# Patient Record
Sex: Female | Born: 1955 | Race: Black or African American | Hispanic: No | Marital: Married | Smoking: Never smoker
Health system: Southern US, Community
[De-identification: ages and names within clinical notes are randomized; demographics above are authoritative.]

## PROBLEM LIST (undated history)

## (undated) DIAGNOSIS — R87629 Unspecified abnormal cytological findings in specimens from vagina: Secondary | ICD-10-CM

## (undated) DIAGNOSIS — D869 Sarcoidosis, unspecified: Secondary | ICD-10-CM

## (undated) DIAGNOSIS — H269 Unspecified cataract: Secondary | ICD-10-CM

## (undated) HISTORY — DX: Unspecified abnormal cytological findings in specimens from vagina: R87.629

## (undated) HISTORY — DX: Unspecified cataract: H26.9

## (undated) HISTORY — PX: NECK SURGERY: SHX720

## (undated) HISTORY — DX: Sarcoidosis, unspecified: D86.9

## (undated) HISTORY — PX: OTHER SURGICAL HISTORY: SHX169

## (undated) HISTORY — PX: TUBAL LIGATION: SHX77

## (undated) HISTORY — PX: HERNIA REPAIR: SHX51

---

## 1998-11-01 ENCOUNTER — Other Ambulatory Visit: Admission: RE | Admit: 1998-11-01 | Discharge: 1998-11-01 | Payer: Self-pay | Admitting: Obstetrics & Gynecology

## 2000-07-21 ENCOUNTER — Other Ambulatory Visit: Admission: RE | Admit: 2000-07-21 | Discharge: 2000-07-21 | Payer: Self-pay | Admitting: Obstetrics & Gynecology

## 2001-12-25 ENCOUNTER — Emergency Department (HOSPITAL_COMMUNITY): Admission: EM | Admit: 2001-12-25 | Discharge: 2001-12-25 | Payer: Self-pay | Admitting: Emergency Medicine

## 2002-01-03 ENCOUNTER — Encounter: Payer: Self-pay | Admitting: General Surgery

## 2002-01-03 ENCOUNTER — Encounter: Admission: RE | Admit: 2002-01-03 | Discharge: 2002-01-03 | Payer: Self-pay | Admitting: General Surgery

## 2002-01-05 ENCOUNTER — Ambulatory Visit (HOSPITAL_BASED_OUTPATIENT_CLINIC_OR_DEPARTMENT_OTHER): Admission: RE | Admit: 2002-01-05 | Discharge: 2002-01-05 | Payer: Self-pay | Admitting: General Surgery

## 2002-01-05 ENCOUNTER — Encounter (INDEPENDENT_AMBULATORY_CARE_PROVIDER_SITE_OTHER): Payer: Self-pay | Admitting: Specialist

## 2002-07-11 ENCOUNTER — Other Ambulatory Visit: Admission: RE | Admit: 2002-07-11 | Discharge: 2002-07-11 | Payer: Self-pay | Admitting: Obstetrics & Gynecology

## 2005-02-04 ENCOUNTER — Other Ambulatory Visit: Admission: RE | Admit: 2005-02-04 | Discharge: 2005-02-04 | Payer: Self-pay | Admitting: Obstetrics & Gynecology

## 2010-02-13 ENCOUNTER — Encounter
Admission: RE | Admit: 2010-02-13 | Discharge: 2010-02-13 | Payer: Self-pay | Source: Home / Self Care | Attending: Obstetrics & Gynecology | Admitting: Obstetrics & Gynecology

## 2010-07-04 NOTE — Op Note (Signed)
NAME:  Melanie Hicks                         ACCOUNT NO.:  [id number]   MEDICAL RECORD NO.:  [id number]                   PATIENT TYPE:  EMS   LOCATION:  MAJO                                 FACILITY:  [facility]   PHYSICIAN:  Anselm Pancoast. Zachery Dakins, M.D.          DATE OF BIRTH:  08-31-1955   DATE OF PROCEDURE:  01/05/2002  DATE OF DISCHARGE:  12/25/2001                                 OPERATIVE REPORT   PREOPERATIVE DIAGNOSIS:  Ventral hernia.   POSTOPERATIVE DIAGNOSIS:  Ventral hernia.   OPERATION PERFORMED:  Repair of incarcerated ventral hernia with general  anesthesia.   SURGEON:  Anselm Pancoast. Zachery Dakins, M.D.   ASSISTANT:  Nurse.   ANESTHESIA:  General.   INDICATIONS FOR PROCEDURE:  The patient is a 55 year old female who was  referred to [place] for management of a bulge in the epigastric area.  The  patient stated that she had first noticed this several months or more ago.  It kind of gradually increased in size and if she strains real hard, it will  be about probably four inches or so across, but the actual area appears to  be a small fascial defect.  In her past history, she was diagnosed as having  sarcoidosis with an abnormal chest x-ray in her 73s, but she has had no  progression or problems pulmonary wise and is otherwise thought to be in  good health.  I recommended that we repair this with general anesthesia and  place a piece of mesh in the preperitoneal area and I think that actually  what we got is incarcerated preperitoneal fat up into and above the fascia  through a small fascial defect.  I scheduled her at [facility] and she is here today for the planned procedure.   DESCRIPTION OF PROCEDURE:  She was given a gram of Kefzol and taken back to  the operative suite, induction of general anesthesia.  We had marked the  general area and then after the abdomen was prepped with Betadine surgical  scrub and draped in sterile manner, a small vertical  incision was made  directly over the area.  I dissected down and in the subcutaneous tissue  there was an area that was obviously different fatty tissue and this is the  top of an area that at first I thought it maybe was a lipoma but it was  obviously coming up through the ventral fascia and the area was probably  about four inches across but the actual little central portion was kind of a  small mushroom type stalk area.  I carefully opened into it making sure that  there was no bowel up in the area and I think it predominantly all  preperitoneal  fat and the pedicle was divided between [facility] and these  were ligated with either 2 or 3-0 Vicryl and some were sutured with 2-0  Vicryl.  I then  opened the fascia just a little bit so I would have enough  working room that I could get a piece of mesh up in the preperitoneal space  about 3 x 3 inches.  I anchored it laterally in all four corners with 0  Prolene sutured up widely placing the stitches actually through the  abdominal incision and then these were all tied after the mesh was in place  and then I closed the actual fascia over it incorporating the fascia to the  actual mesh also with 0 Prolene sutures.  The subcutaneous tissue was then  closed with 3-0 Vicryl and the skin was closed with 4-0 nylon.  I placed  Marcaine in the fascia to hopefully minimize the postoperative pain and I  think she can be released after a short stay in the recovery room.  She is  going to be off work for approximately two weeks.  She works at a day care  and she is aware that she should not be picking up anything over about 25  pounds for probably about four weeks.  I could not appreciate any other  definite fascia  defects and I did send this very large specimen for pathology exam but I  expect it is just a fatty tissue that is really not a lipoma but just  incarceration of the preperitoneal space, tissue that had occurred over a  significant length of  time.                                                 Anselm Pancoast. Zachery Dakins, M.D.    WJW/MEDQ  D:  01/05/2002  T:  01/05/2002  Job:  [id number]   cc:   Duncan Dull, M.D.  [address]  [place]  [place] [address]  Fax: [phone]

## 2013-07-28 ENCOUNTER — Other Ambulatory Visit: Payer: Self-pay | Admitting: Obstetrics & Gynecology

## 2013-07-28 DIAGNOSIS — R928 Other abnormal and inconclusive findings on diagnostic imaging of breast: Secondary | ICD-10-CM

## 2013-08-08 ENCOUNTER — Other Ambulatory Visit: Payer: Self-pay

## 2013-08-16 ENCOUNTER — Ambulatory Visit
Admission: RE | Admit: 2013-08-16 | Discharge: 2013-08-16 | Disposition: A | Discharge status: Home / Self Care | Payer: BC Managed Care – PPO | Source: Ambulatory Visit | Attending: Obstetrics & Gynecology | Admitting: Obstetrics & Gynecology

## 2013-08-16 DIAGNOSIS — R928 Other abnormal and inconclusive findings on diagnostic imaging of breast: Secondary | ICD-10-CM

## 2015-03-29 ENCOUNTER — Encounter: Payer: Self-pay | Admitting: Gastroenterology

## 2015-05-24 ENCOUNTER — Ambulatory Visit (AMBULATORY_SURGERY_CENTER): Payer: Self-pay | Admitting: *Deleted

## 2015-05-24 VITALS — Ht 62.5 in | Wt 181.8 lb

## 2015-05-24 DIAGNOSIS — Z1211 Encounter for screening for malignant neoplasm of colon: Secondary | ICD-10-CM

## 2015-05-24 MED ORDER — SUPREP BOWEL PREP KIT 17.5-3.13-1.6 GM/177ML PO SOLN: 1.0000 | Freq: Once | ORAL | Status: DC

## 2015-05-24 NOTE — Progress Notes (Signed)
Patient denies any allergies to egg or soy products. Patient denies complications with anesthesia/sedation.  Patient denies oxygen use at home and denies diet medications. Emmi instructions for colonoscopy explained and given to patient.  

## 2015-06-07 ENCOUNTER — Encounter: Payer: Self-pay | Admitting: Gastroenterology

## 2015-06-07 ENCOUNTER — Ambulatory Visit (AMBULATORY_SURGERY_CENTER): Payer: BC Managed Care – PPO | Admitting: Gastroenterology

## 2015-06-07 VITALS — BP 106/75 | HR 74 | Temp 96.0°F | Resp 17 | Ht 62.5 in | Wt 181.0 lb

## 2015-06-07 DIAGNOSIS — Z1211 Encounter for screening for malignant neoplasm of colon: Secondary | ICD-10-CM | POA: Diagnosis not present

## 2015-06-07 DIAGNOSIS — Z800 Family history of malignant neoplasm of digestive organs: Secondary | ICD-10-CM

## 2015-06-07 DIAGNOSIS — D122 Benign neoplasm of ascending colon: Secondary | ICD-10-CM | POA: Diagnosis not present

## 2015-06-07 DIAGNOSIS — D123 Benign neoplasm of transverse colon: Secondary | ICD-10-CM | POA: Diagnosis not present

## 2015-06-07 MED ORDER — SODIUM CHLORIDE 0.9 % IV SOLN: 500.0000 mL | INTRAVENOUS | Status: DC

## 2015-06-07 NOTE — Op Note (Signed)
[facility] Patient Name: Melanie Hicks Procedure Date: 06/07/2015 8:58 AM MRN: [id number] Endoscopist: Lyman. Loletha Carrow , MD Age: 60 Date of Birth: Dec 05, 1955 Gender: Female Procedure:                Colonoscopy Indications:              Screening in patient at increased risk: Colorectal                            cancer in sister before age 28 Medicines:                Monitored Anesthesia Care Procedure:                Pre-Anesthesia Assessment:                           - Prior to the procedure, a History and Physical                            was performed, and patient medications and                            allergies were reviewed. The patient's tolerance of                            previous anesthesia was also reviewed. The risks                            and benefits of the procedure and the sedation                            options and risks were discussed with the patient.                            All questions were answered, and informed consent                            was obtained. Prior Anticoagulants: The patient has                            taken aspirin, last dose was 1 day prior to                            procedure. ASA Grade Assessment: II - A patient                            with mild systemic disease. After reviewing the                            risks and benefits, the patient was deemed in                            satisfactory condition to undergo the procedure.  After obtaining informed consent, the colonoscope                            was passed under direct vision. Throughout the                            procedure, the patient's blood pressure, pulse, and                            oxygen saturations were monitored continuously. The                            Model PCF-H190L [id number] [id number]) scope was introduced                            through the anus and advanced to the the cecum,            identified by appendiceal orifice and ileocecal                            valve. The colonoscopy was performed without                            difficulty. The patient tolerated the procedure                            well. The quality of the bowel preparation was                            excellent. The ileocecal valve, appendiceal                            orifice, and rectum were photographed. Scope In: 9:10:24 AM Scope Out: 9:32:16 AM Scope Withdrawal Time: 0 hours 18 minutes 46 seconds  Total Procedure Duration: 0 hours 21 minutes 52 seconds  Findings:                 The perianal and digital rectal examinations were                            normal.                           Three semi-pedunculated polyps were found in the                            proximal transverse colon, proximal ascending colon                            and distal ascending colon. The polyps were 8 to 12                            mm in size. These polyps were removed with a hot  snare. Resection and retrieval were complete.                           The exam was otherwise without abnormality on                            direct and retroflexion views. Complications:            No immediate complications. Estimated Blood Loss:     Estimated blood loss: none. Impression:               - Three 8 to 12 mm polyps in the proximal                            transverse colon, in the proximal ascending colon                            and in the distal ascending colon, removed with a                            hot snare. Resected and retrieved.                           - The examination was otherwise normal on direct                            and retroflexion views. Recommendation:           - Resume previous diet.                           - No aspirin, ibuprofen, naproxen, or other                            non-steroidal anti-inflammatory drugs for 5 days                             after polyp removal.                           - Await pathology results.                           - Repeat colonoscopy is recommended for                            surveillance. The colonoscopy date will be                            determined after pathology results from today's                            exam become available for review. Henry L. Loletha Carrow, MD 06/07/2015 9:37:29 AM This report has been signed electronically.

## 2015-06-07 NOTE — Progress Notes (Signed)
Called to room to assist during endoscopic procedure.  Patient ID and intended procedure confirmed with present staff. Received instructions for my participation in the procedure from the performing physician.  

## 2015-06-07 NOTE — Progress Notes (Signed)
To pacu vss patent aw report to rn 

## 2015-06-07 NOTE — Patient Instructions (Signed)
YOU HAD AN ENDOSCOPIC PROCEDURE TODAY AT [facility]:   Refer to the procedure report that was given to you for any specific questions about what was found during the examination.  If the procedure report does not answer your questions, please call your gastroenterologist to clarify.  If you requested that your care partner not be given the details of your procedure findings, then the procedure report has been included in a sealed envelope for you to review at your convenience later.  YOU SHOULD EXPECT: Some feelings of bloating in the abdomen. Passage of more gas than usual.  Walking can help get rid of the air that was put into your GI tract during the procedure and reduce the bloating. If you had a lower endoscopy (such as a colonoscopy or flexible sigmoidoscopy) you may notice spotting of blood in your stool or on the toilet paper. If you underwent a bowel prep for your procedure, you may not have a normal bowel movement for a few days.  Please Note:  You might notice some irritation and congestion in your nose or some drainage.  This is from the oxygen used during your procedure.  There is no need for concern and it should clear up in a day or so.  SYMPTOMS TO REPORT IMMEDIATELY:   Following lower endoscopy (colonoscopy or flexible sigmoidoscopy):  Excessive amounts of blood in the stool  Significant tenderness or worsening of abdominal pains  Swelling of the abdomen that is new, acute  Fever of 100F or higher  For urgent or emergent issues, a gastroenterologist can be reached at any hour by calling [phone].   DIET: Your first meal following the procedure should be a small meal and then it is ok to progress to your normal diet. Heavy or fried foods are harder to digest and may make you feel nauseous or bloated.  Likewise, meals heavy in dairy and vegetables can increase bloating.  Drink plenty of fluids but you should avoid alcoholic beverages for 24  hours.  ACTIVITY:  You should plan to take it easy for the rest of today and you should NOT DRIVE or use heavy machinery until tomorrow (because of the sedation medicines used during the test).    FOLLOW UP: Our staff will call the number listed on your records the next business day following your procedure to check on you and address any questions or concerns that you may have regarding the information given to you following your procedure. If we do not reach you, we will leave a message.  However, if you are feeling well and you are not experiencing any problems, there is no need to return our call.  We will assume that you have returned to your regular daily activities without incident.  If any biopsies were taken you will be contacted by phone or by letter within the next 1-3 weeks.  Please call us at [phone] if you have not heard about the biopsies in 3 weeks.    SIGNATURES/CONFIDENTIALITY: You and/or your care partner have signed paperwork which will be entered into your electronic medical record.  These signatures attest to the fact that that the information above on your After Visit Summary has been reviewed and is understood.  Full responsibility of the confidentiality of this discharge information lies with you and/or your care-partner.  Polyps-handout given  Repeat colonoscopy will be determined by pathology.  Hold aspirin and NSAIDs for 5 days (ibuprofen,motrin, advil, aleve, naproxen,etc)

## 2015-06-10 ENCOUNTER — Telehealth: Payer: Self-pay

## 2015-06-10 NOTE — Telephone Encounter (Signed)
  Follow up Call-  Call back number 06/07/2015  Post procedure Call Back phone  # [phone]  Permission to leave phone message Yes     Patient questions:  Do you have a fever, pain , or abdominal swelling? No. Pain Score  0 *  Have you tolerated food without any problems? Yes.    Have you been able to return to your normal activities? Yes.    Do you have any questions about your discharge instructions: Diet   No. Medications  No. Follow up visit  No.  Do you have questions or concerns about your Care? No.  Actions: * If pain score is 4 or above: No action needed, pain <4.  No problems noted per the pt. maw

## 2015-06-11 ENCOUNTER — Encounter: Payer: Self-pay | Admitting: Gastroenterology

## 2015-10-03 ENCOUNTER — Other Ambulatory Visit: Payer: Self-pay | Admitting: Obstetrics & Gynecology

## 2015-10-03 DIAGNOSIS — Z1231 Encounter for screening mammogram for malignant neoplasm of breast: Secondary | ICD-10-CM

## 2015-10-16 ENCOUNTER — Ambulatory Visit
Admission: RE | Admit: 2015-10-16 | Discharge: 2015-10-16 | Disposition: A | Discharge status: Home / Self Care | Payer: BC Managed Care – PPO | Source: Ambulatory Visit | Attending: Obstetrics & Gynecology | Admitting: Obstetrics & Gynecology

## 2015-10-16 DIAGNOSIS — Z1231 Encounter for screening mammogram for malignant neoplasm of breast: Secondary | ICD-10-CM

## 2017-09-30 ENCOUNTER — Other Ambulatory Visit: Payer: Self-pay | Admitting: Obstetrics & Gynecology

## 2017-09-30 DIAGNOSIS — Z1231 Encounter for screening mammogram for malignant neoplasm of breast: Secondary | ICD-10-CM

## 2017-10-14 ENCOUNTER — Ambulatory Visit
Admission: RE | Admit: 2017-10-14 | Discharge: 2017-10-14 | Disposition: A | Discharge status: Home / Self Care | Payer: BC Managed Care – PPO | Source: Ambulatory Visit | Attending: Obstetrics & Gynecology | Admitting: Obstetrics & Gynecology

## 2017-10-14 DIAGNOSIS — Z1231 Encounter for screening mammogram for malignant neoplasm of breast: Secondary | ICD-10-CM

## 2018-06-24 ENCOUNTER — Encounter: Payer: Self-pay | Admitting: Gastroenterology

## 2019-05-09 ENCOUNTER — Other Ambulatory Visit: Payer: Self-pay | Admitting: Obstetrics and Gynecology

## 2019-05-09 DIAGNOSIS — Z1231 Encounter for screening mammogram for malignant neoplasm of breast: Secondary | ICD-10-CM

## 2019-05-10 ENCOUNTER — Ambulatory Visit: Payer: BC Managed Care – PPO

## 2019-06-19 ENCOUNTER — Other Ambulatory Visit: Payer: Self-pay

## 2019-06-19 ENCOUNTER — Ambulatory Visit
Admission: RE | Admit: 2019-06-19 | Discharge: 2019-06-19 | Disposition: A | Discharge status: Home / Self Care | Payer: BC Managed Care – PPO | Source: Ambulatory Visit | Attending: Obstetrics and Gynecology | Admitting: Obstetrics and Gynecology

## 2019-06-19 DIAGNOSIS — Z1231 Encounter for screening mammogram for malignant neoplasm of breast: Secondary | ICD-10-CM

## 2020-09-18 IMAGING — MG DIGITAL SCREENING BILAT W/ TOMO W/ CAD
6 of 10 series · 6 of 30 positions shown · non-contrast
Comparison: Previous exam(s).

CLINICAL DATA: Screening.

EXAM:
DIGITAL SCREENING BILATERAL MAMMOGRAM WITH TOMO AND CAD

[L CC synth-2D (1 of 2)]
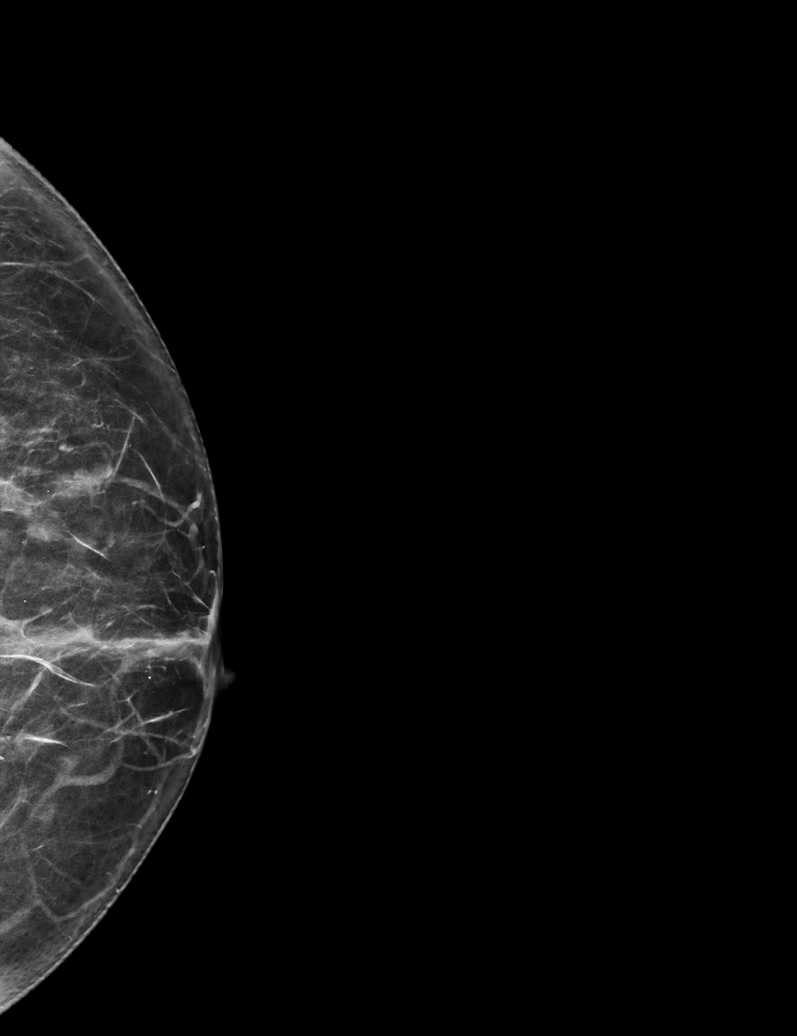

[R MLO synth-2D]
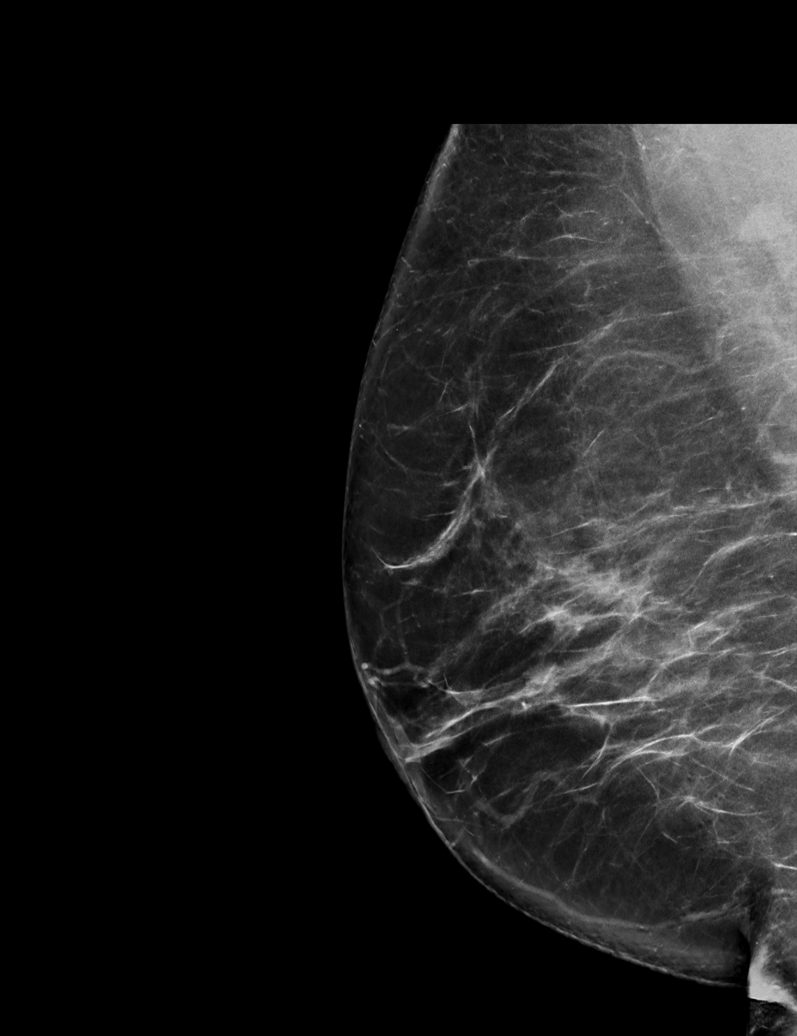

[L CC synth-2D (2 of 2)]
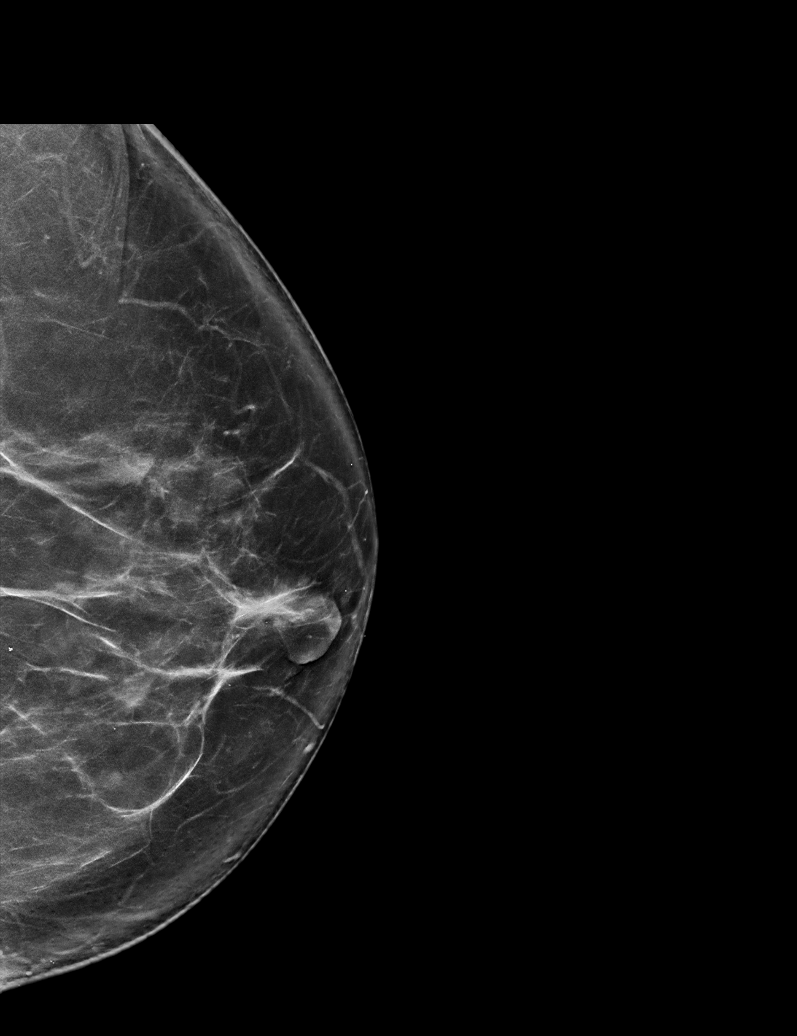

[L MLO synth-2D]
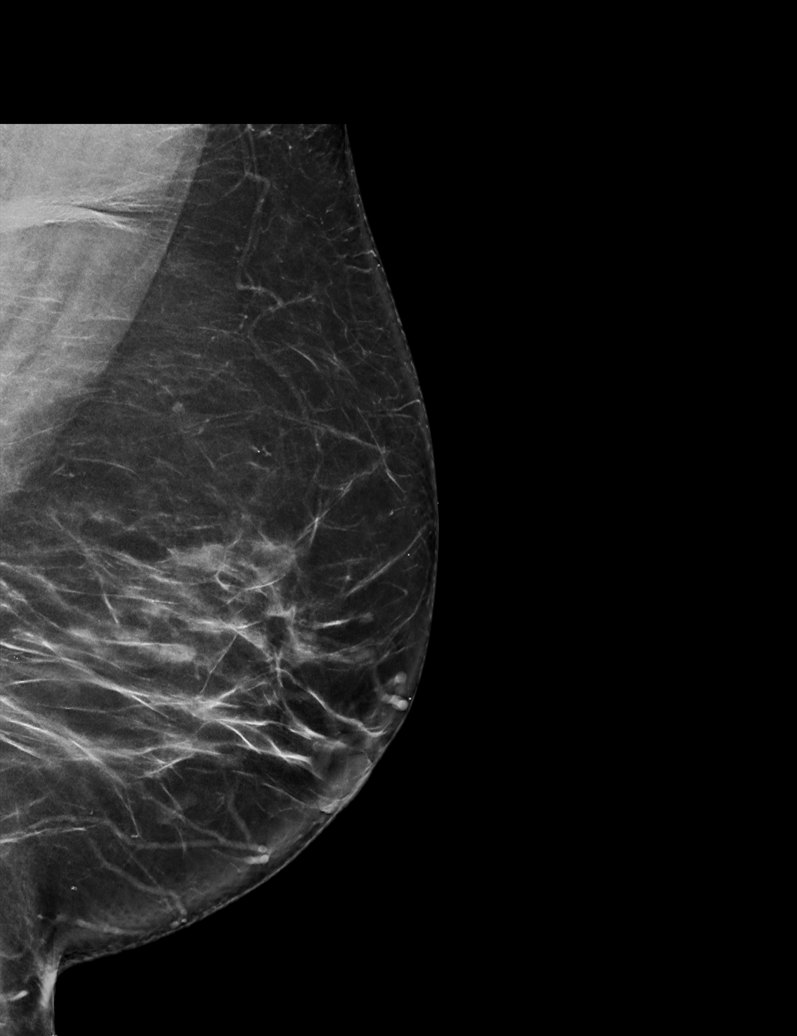

[R CC synth-2D]
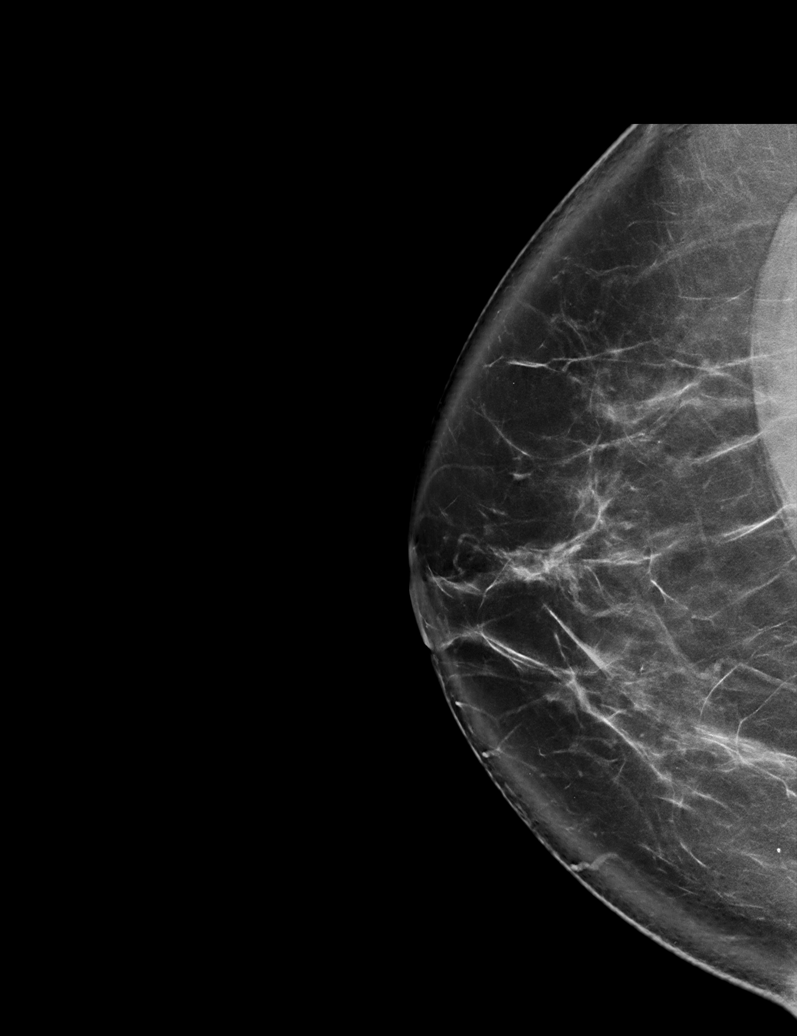

[L CC tomo · tomo slice 31/60.0]
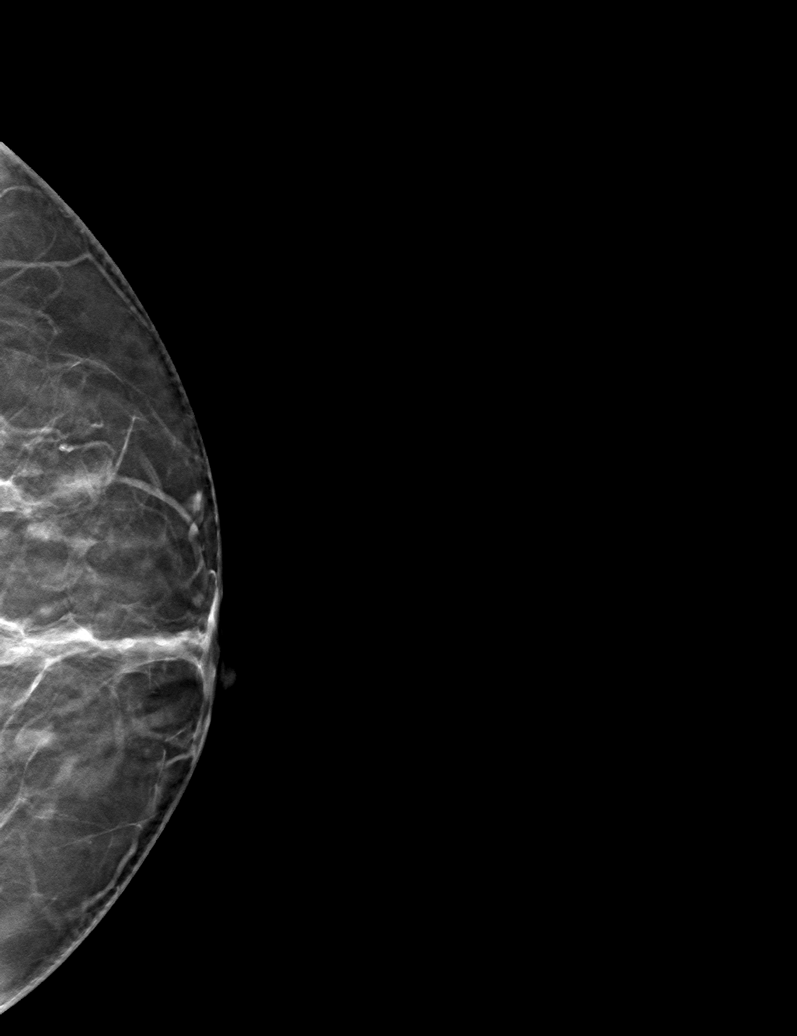

[6 of 30 positions shown; findings below may reference images not displayed]

ACR Breast Density Category b: There are scattered areas of
fibroglandular density.
FINDINGS: There are no findings suspicious for malignancy. Images were
processed with CAD.
IMPRESSION: No mammographic evidence of malignancy. A result letter of this
screening mammogram will be mailed directly to the patient.

RECOMMENDATION:
Screening mammogram in one year. (Code:CN-U-775)

BI-RADS CATEGORY  1: Negative.

## 2022-04-25 ENCOUNTER — Encounter (HOSPITAL_COMMUNITY): Payer: Self-pay

## 2022-04-25 ENCOUNTER — Ambulatory Visit (HOSPITAL_COMMUNITY)
Admission: EM | Admit: 2022-04-25 | Discharge: 2022-04-25 | Disposition: A | Discharge status: Home / Self Care | Payer: BC Managed Care – PPO | Attending: Physician Assistant | Admitting: Physician Assistant

## 2022-04-25 DIAGNOSIS — R202 Paresthesia of skin: Secondary | ICD-10-CM

## 2022-04-25 DIAGNOSIS — R030 Elevated blood-pressure reading, without diagnosis of hypertension: Secondary | ICD-10-CM

## 2022-04-25 MED ORDER — AMLODIPINE BESYLATE 5 MG PO TABS: 5.0000 mg | ORAL_TABLET | Freq: Every day | ORAL | 2 refills | Status: AC

## 2022-04-25 NOTE — ED Provider Notes (Signed)
[name]    CSN: [id number] Arrival date & time: 04/25/22  1027      History   Chief Complaint Chief Complaint  Patient presents with   arm tingling    HPI Melanie Hicks is a 67 y.o. female.   67 year old female presents with left arm tingling.  The patient indicates that yesterday she started having intermittent left arm tingling which has continued into today.  She indicates she is not having numbness, or weakness.  Patient indicates that she noticed her blood pressure is elevated today.  She does have a family history of having elevated blood pressure.  Patient indicates that she did not have blood pressure elevation that she is aware of at her last medical visit which was at her women's health provider a year ago.  Patient indicates she is not having any headache, vision changes, shortness of breath, chest pain, nausea or vomiting.  Patient indicates she is not having any right upper or lower extremity numbness or tingling.     Past Medical History:  Diagnosis Date   Abnormal vaginal Pap smear    Cataract    bilateral   Sarcoidosis    Hx - no problems as adult   SVD (spontaneous vaginal delivery)    x 3    There are no problems to display for this patient.   Past Surgical History:  Procedure Laterality Date   cyro     abnormal pap   HERNIA REPAIR     NECK SURGERY     biopsy for sarcoidosis   TUBAL LIGATION      OB History   No obstetric history on file.      Home Medications    Prior to Admission medications   Medication Sig Start Date End Date Taking? Authorizing Provider  amLODipine (NORVASC) 5 MG tablet Take 1 tablet (5 mg total) by mouth daily. 04/25/22  Yes Nyoka Lint, PA-C  aspirin 81 MG tablet Take 81 mg by mouth daily. Reported on 05/24/2015   Yes [provider]  cholecalciferol (VITAMIN D) 1000 units tablet Take 1,000 Units by mouth daily.   Yes [provider]    Family History Family History  Problem  Relation Age of Onset   Colon polyps Sister    Colon cancer Neg Hx    Esophageal cancer Neg Hx    Rectal cancer Neg Hx    Stomach cancer Neg Hx    Breast cancer Neg Hx     Social History Social History   Tobacco Use   Smoking status: Never   Smokeless tobacco: Never  Substance Use Topics   Alcohol use: No    Alcohol/week: 0.0 standard drinks of alcohol   Drug use: No     Allergies   Patient has no known allergies.   Review of Systems Review of Systems   Physical Exam Triage Vital Signs ED Triage Vitals  Enc Vitals Group     BP 04/25/22 1126 (!) 160/110     Pulse Rate 04/25/22 1126 72     Resp 04/25/22 1126 16     Temp 04/25/22 1126 98 F (36.7 C)     Temp Source 04/25/22 1126 Oral     SpO2 04/25/22 1126 95 %     Weight --      Height --      Head Circumference --      Peak Flow --      Pain Score 04/25/22 1128 3  Pain Loc --      Pain Edu? --      Excl. in [place]? --    No data found.  Updated Vital Signs BP (!) 160/110 (BP Location: Right Arm)   Pulse 72   Temp 98 F (36.7 C) (Oral)   Resp 16   SpO2 95%   Visual Acuity Right Eye Distance:   Left Eye Distance:   Bilateral Distance:    Right Eye Near:   Left Eye Near:    Bilateral Near:     Physical Exam Constitutional:      Appearance: Normal appearance.  HENT:     Right Ear: Tympanic membrane and ear canal normal.     Left Ear: Tympanic membrane and ear canal normal.     Mouth/Throat:     Mouth: Mucous membranes are moist.     Pharynx: Oropharynx is clear. Uvula midline.  Cardiovascular:     Rate and Rhythm: Normal rate and regular rhythm.     Heart sounds: Normal heart sounds.  Pulmonary:     Effort: Pulmonary effort is normal.     Breath sounds: Normal breath sounds and air entry. No wheezing, rhonchi or rales.  Abdominal:     General: Abdomen is flat. Bowel sounds are normal.     Palpations: Abdomen is soft.     Tenderness: There is no abdominal tenderness.  Lymphadenopathy:      Cervical: No cervical adenopathy.  Neurological:     General: No focal deficit present.     Mental Status: She is alert.     Cranial Nerves: Cranial nerves 2-12 are intact.     Motor: Motor function is intact.     Coordination: Coordination is intact.     Gait: Gait is intact.      UC Treatments / Results  Labs (all labs ordered are listed, but only abnormal results are displayed) Labs Reviewed - No data to display  EKG: Reason: left arm tingling Symptoms: left arm tingling, elevated BP Interpretation: no acute changes, normal EKG. Treatment: start Norvasc '5mg'$  daily for BP control.   Radiology No results found.  Procedures Procedures (including critical care time)  Medications Ordered in UC Medications - No data to display  Initial Impression / Assessment and Plan / UC Course  I have reviewed the triage vital signs and the nursing notes.  Pertinent labs & imaging results that were available during my care of the patient were reviewed by me and considered in my medical decision making (see chart for details).    Plan: The diagnosis will be treated with the following: 1.  Left arm paresthesia: A.  Start amlodipine 5 mg once daily to control blood pressure. 2.  Elevated blood pressure: A.  Start amlodipine 5 mg once daily to control blood pressure. 3.  Patient advised to become established with PCP in order to have blood pressure reevaluated and medication adjusted in the next 3 to 4 weeks. 4.  Patient advised to report to ER or return to urgent care over the next week if symptoms fail to improve or if symptoms progress and is followed with numbness and tingling. Final Clinical Impressions(s) / UC Diagnoses   Final diagnoses:  Elevated blood pressure reading  Arm paresthesia, left     Discharge Instructions      Advised to start amlodipine 5 mg, 1 tablet daily to help control blood pressure. Advised to decrease salt intake, observe and decrease caffeine  intake to help reduce blood pressure.  Advised to become established with PCP in order to have blood pressure rechecked and reevaluated in the next 3 to 4 weeks.  Advised to return to urgent care as needed.    ED Prescriptions     Medication Sig Dispense Auth. Provider   amLODipine (NORVASC) 5 MG tablet Take 1 tablet (5 mg total) by mouth daily. 30 tablet Nyoka Lint, PA-C      PDMP not reviewed this encounter.   Nyoka Lint, PA-C 04/25/22 1250

## 2022-04-25 NOTE — Discharge Instructions (Addendum)
Advised to start amlodipine 5 mg, 1 tablet daily to help control blood pressure. Advised to decrease salt intake, observe and decrease caffeine intake to help reduce blood pressure.  Advised to become established with PCP in order to have blood pressure rechecked and reevaluated in the next 3 to 4 weeks.  Advised to return to urgent care as needed.

## 2022-04-25 NOTE — ED Triage Notes (Signed)
Pt states left arm  has been having a tingling sensation in the left arm

## 2024-02-02 ENCOUNTER — Other Ambulatory Visit: Payer: Self-pay | Admitting: Obstetrics and Gynecology

## 2024-02-02 DIAGNOSIS — R928 Other abnormal and inconclusive findings on diagnostic imaging of breast: Secondary | ICD-10-CM

## 2024-02-18 ENCOUNTER — Ambulatory Visit
Admission: RE | Admit: 2024-02-18 | Discharge: 2024-02-18 | Disposition: A | Discharge status: Home / Self Care | Payer: Self-pay | Source: Ambulatory Visit | Attending: Obstetrics and Gynecology | Admitting: Obstetrics and Gynecology

## 2024-02-18 DIAGNOSIS — R928 Other abnormal and inconclusive findings on diagnostic imaging of breast: Secondary | ICD-10-CM
